# Patient Record
Sex: Male | Born: 1960 | Race: White | Hispanic: No
Health system: Southern US, Academic
[De-identification: ages and names within clinical notes are randomized; demographics above are authoritative.]

## PROBLEM LIST (undated history)

## (undated) ENCOUNTER — Ambulatory Visit: Attending: Allergy & Immunology | Primary: Allergy & Immunology

## (undated) ENCOUNTER — Ambulatory Visit

## (undated) ENCOUNTER — Encounter

## (undated) DIAGNOSIS — E039 Hypothyroidism, unspecified: Secondary | ICD-10-CM

## (undated) DIAGNOSIS — F988 Other specified behavioral and emotional disorders with onset usually occurring in childhood and adolescence: Secondary | ICD-10-CM

## (undated) HISTORY — DX: Hypothyroidism, unspecified: E03.9

## (undated) HISTORY — DX: Other specified behavioral and emotional disorders with onset usually occurring in childhood and adolescence: F98.8

---

## 2011-08-05 ENCOUNTER — Ambulatory Visit: Payer: Self-pay

## 2015-09-16 ENCOUNTER — Other Ambulatory Visit: Payer: Self-pay | Admitting: Internal Medicine

## 2015-09-16 DIAGNOSIS — E039 Hypothyroidism, unspecified: Secondary | ICD-10-CM

## 2015-09-17 ENCOUNTER — Ambulatory Visit
Admission: RE | Admit: 2015-09-17 | Discharge: 2015-09-17 | Disposition: A | Discharge status: Home / Self Care | Payer: No Typology Code available for payment source | Source: Ambulatory Visit | Attending: Internal Medicine | Admitting: Internal Medicine

## 2015-09-17 DIAGNOSIS — E039 Hypothyroidism, unspecified: Secondary | ICD-10-CM

## 2017-09-07 IMAGING — US US SOFT TISSUE HEAD/NECK
1 series · 14 of 25 positions shown · non-contrast
Comparison: None.

CLINICAL DATA: Hypothyroidism

EXAM:
THYROID ULTRASOUND
TECHNIQUE: Ultrasound examination of the thyroid gland and adjacent soft
tissues was performed.

[Series 1: us soft tissue head/neck · 0.08mm/px · 14 of 32 slices shown]
[im 1/32]
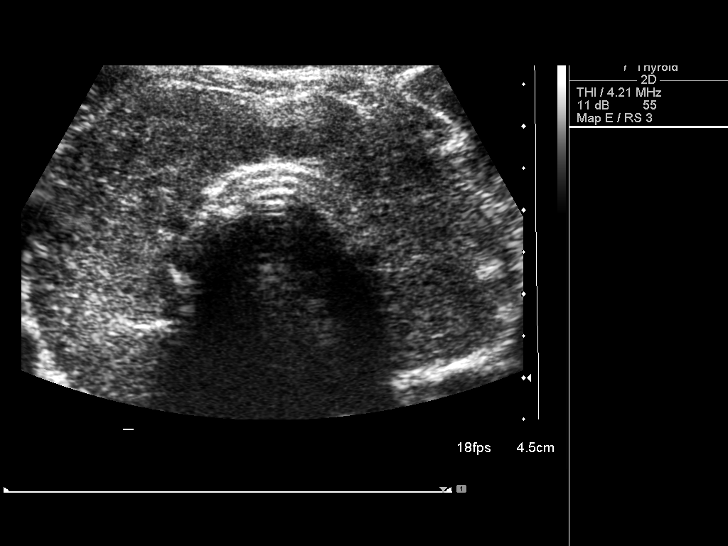
[im 3/32]
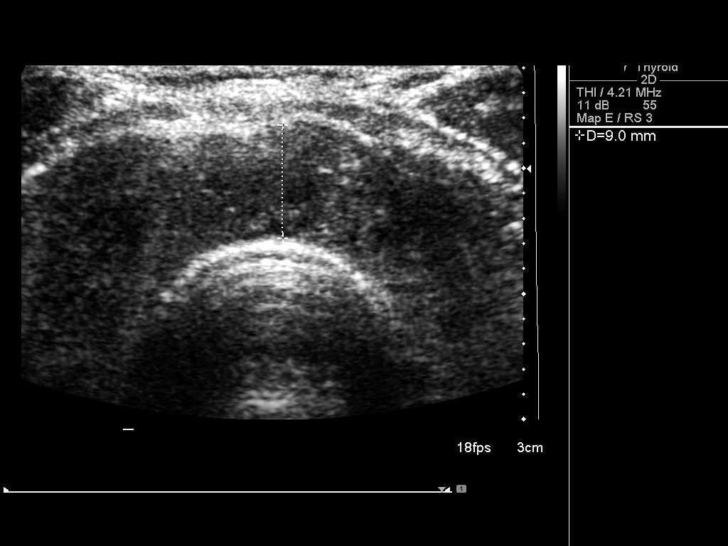
[im 6/32]
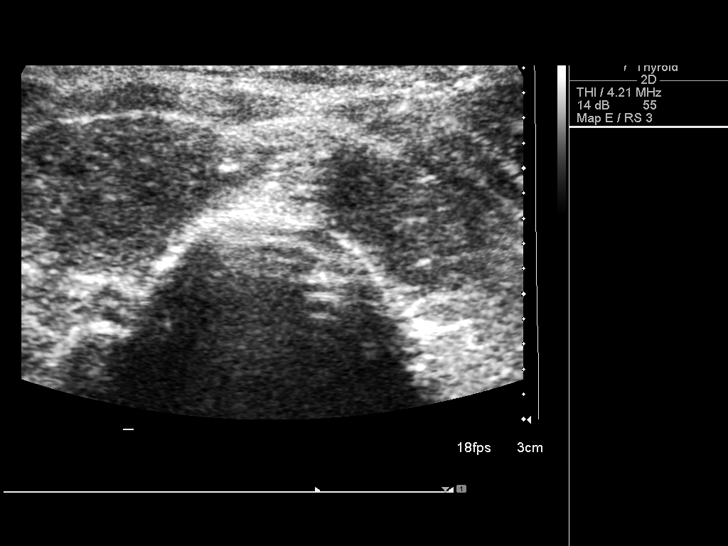
[im 8/32]
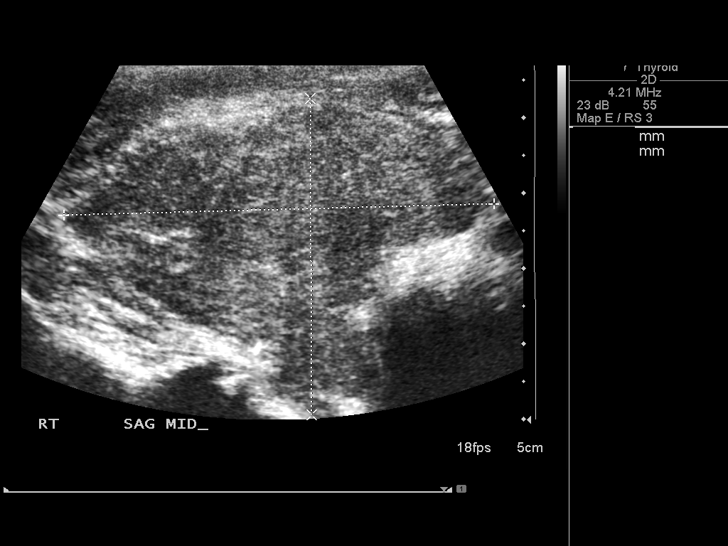
[im 11/32]
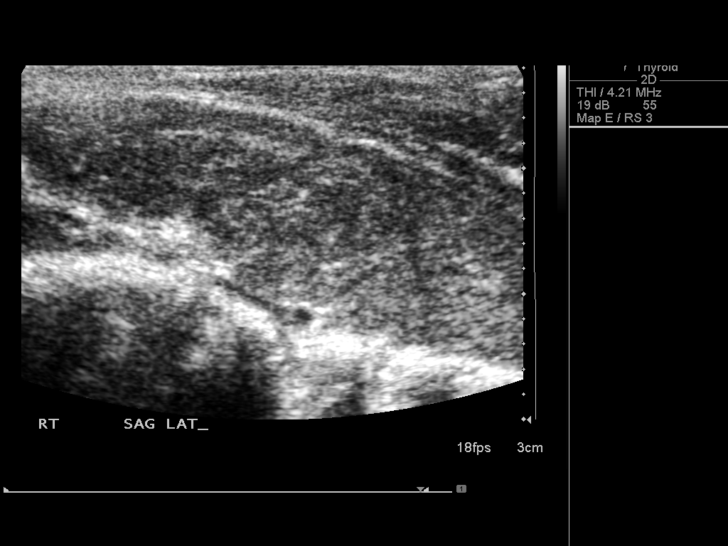
[im 12/32]
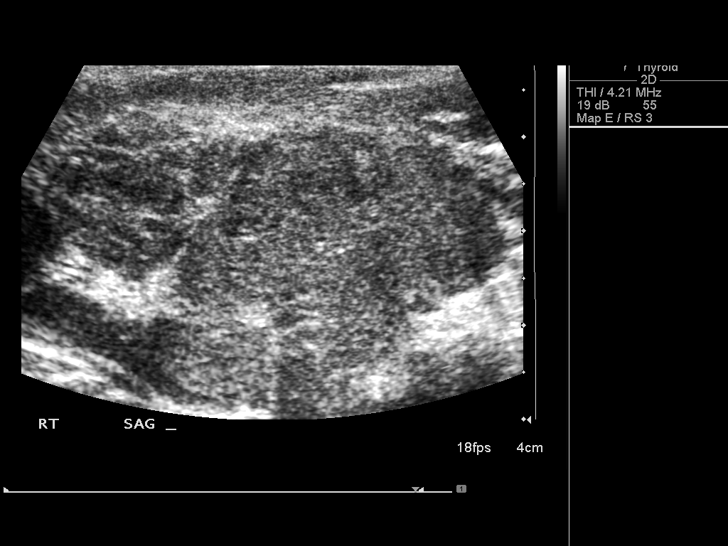
[im 15/32]
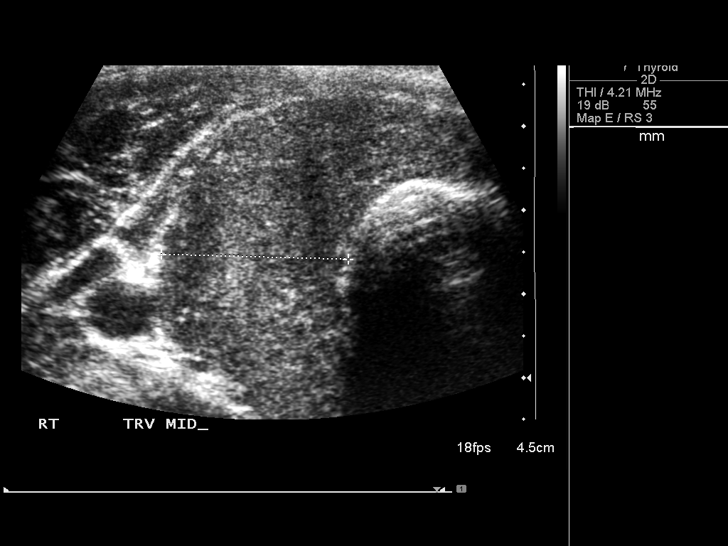
[im 17/32]
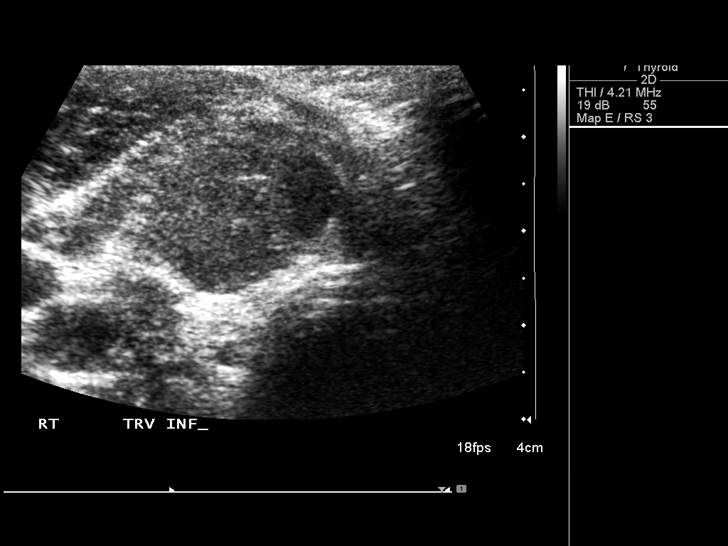
[im 20/32]
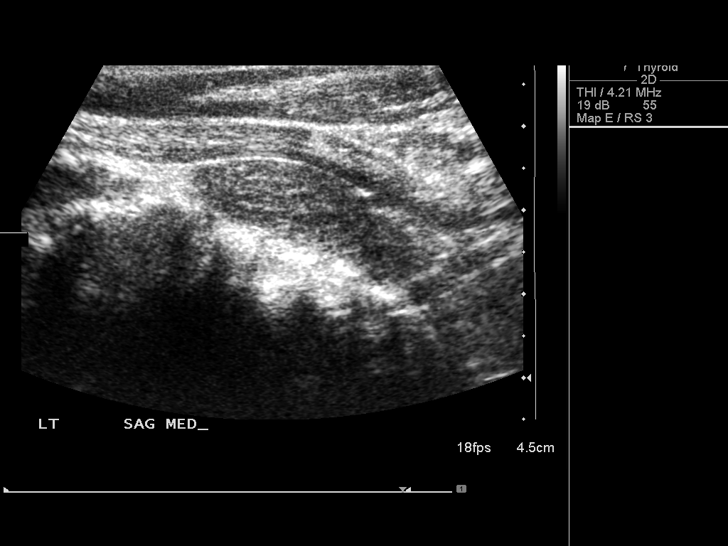
[im 21/32]
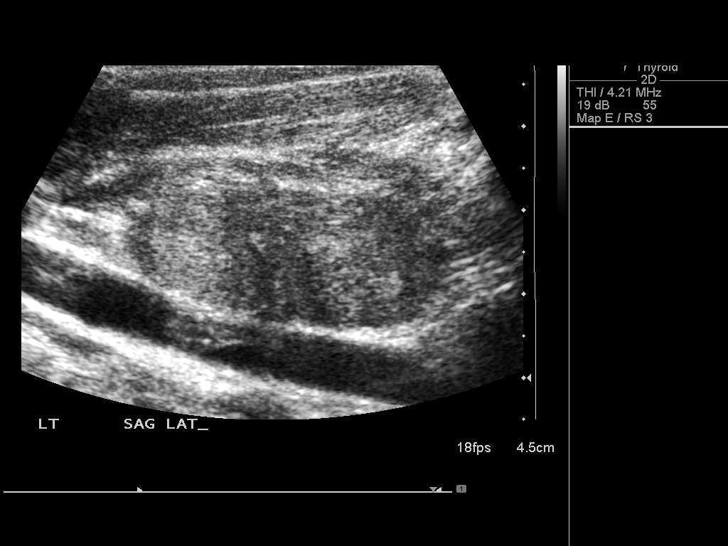
[im 24/32]
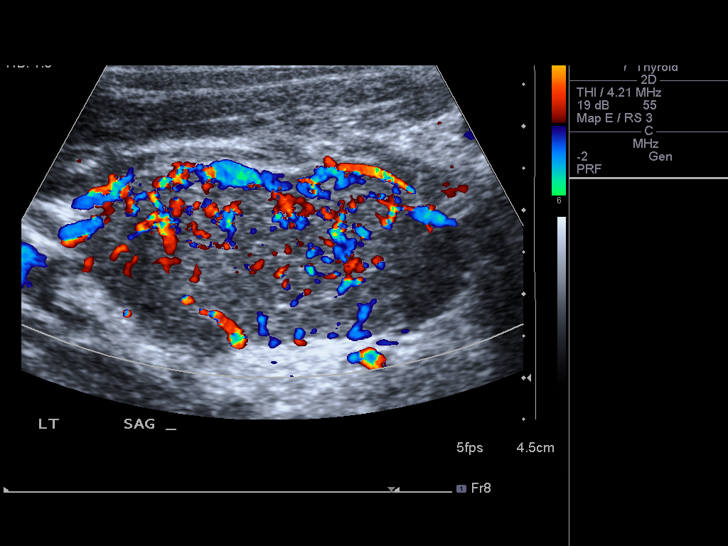
[im 26/32]
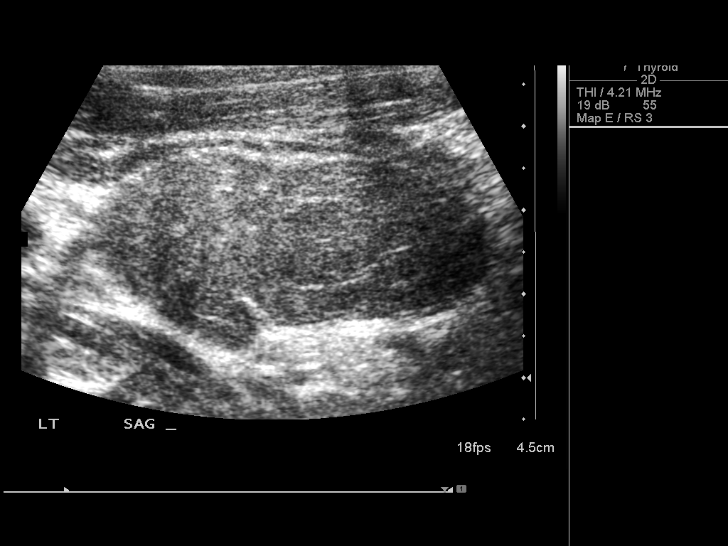
[im 29/32]
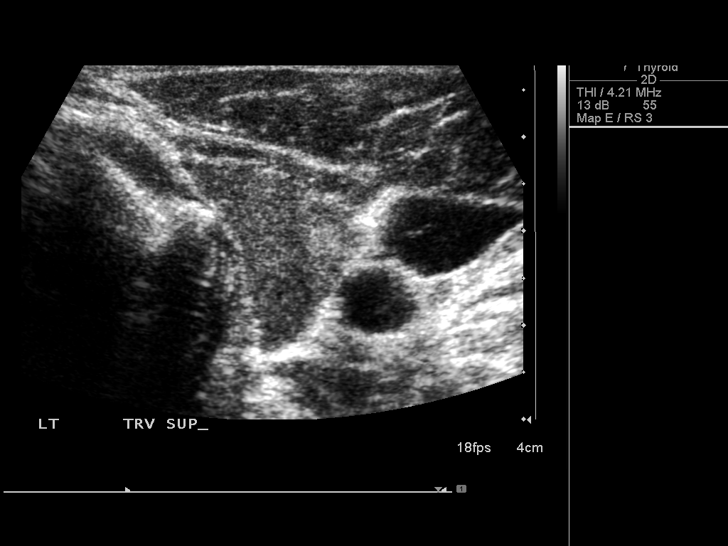
[im 32/32]
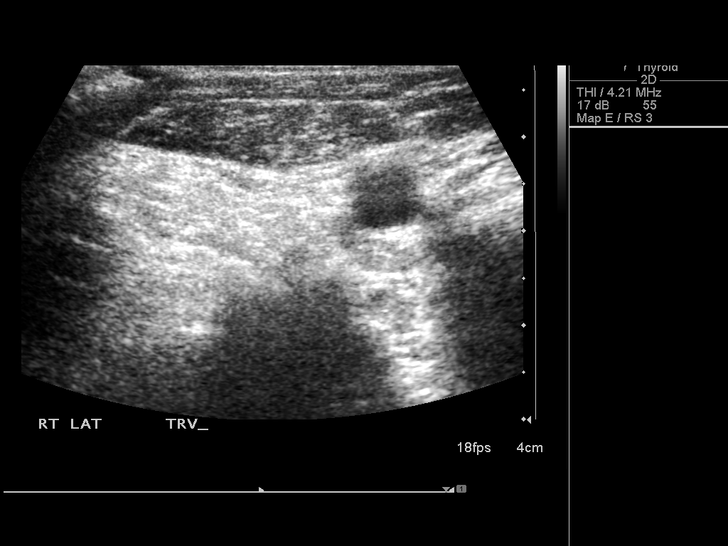

[14 of 25 positions shown; findings below may reference images not displayed]

FINDINGS: Right thyroid lobe

Measurements: 57 x 42 x 22 mm. Inhomogeneous somewhat hyperemic
echotexture without focal lesion.

Left thyroid lobe

Measurements: 54 x 26 x 19 mm.  No nodules visualized.

Isthmus

Thickness: 9 mm.  No nodules visualized.

Lymphadenopathy

None visualized.
IMPRESSION: 1. Mild thyromegaly with inhomogeneous hyperemic parenchyma, no
focal lesion.

## 2018-10-08 ENCOUNTER — Ambulatory Visit
Admission: RE | Admit: 2018-10-08 | Discharge: 2018-10-08 | Disposition: A | Discharge status: Home / Self Care | Payer: No Typology Code available for payment source | Source: Ambulatory Visit | Attending: Internal Medicine | Admitting: Internal Medicine

## 2018-10-08 ENCOUNTER — Other Ambulatory Visit: Payer: Self-pay | Admitting: Internal Medicine

## 2018-10-08 DIAGNOSIS — E010 Iodine-deficiency related diffuse (endemic) goiter: Secondary | ICD-10-CM

## 2020-08-22 DIAGNOSIS — B9689 Other specified bacterial agents as the cause of diseases classified elsewhere: Secondary | ICD-10-CM | POA: Diagnosis not present

## 2020-08-22 DIAGNOSIS — U071 COVID-19: Secondary | ICD-10-CM | POA: Diagnosis not present

## 2020-08-22 DIAGNOSIS — J019 Acute sinusitis, unspecified: Secondary | ICD-10-CM | POA: Diagnosis not present

## 2020-08-31 DIAGNOSIS — R0981 Nasal congestion: Secondary | ICD-10-CM | POA: Diagnosis not present

## 2020-09-14 DIAGNOSIS — J329 Chronic sinusitis, unspecified: Secondary | ICD-10-CM | POA: Diagnosis not present

## 2020-09-14 DIAGNOSIS — J330 Polyp of nasal cavity: Secondary | ICD-10-CM | POA: Diagnosis not present

## 2020-09-14 DIAGNOSIS — J310 Chronic rhinitis: Secondary | ICD-10-CM | POA: Diagnosis not present

## 2020-10-05 DIAGNOSIS — J310 Chronic rhinitis: Secondary | ICD-10-CM | POA: Diagnosis not present

## 2020-10-05 DIAGNOSIS — J330 Polyp of nasal cavity: Secondary | ICD-10-CM | POA: Diagnosis not present

## 2020-10-05 DIAGNOSIS — J329 Chronic sinusitis, unspecified: Secondary | ICD-10-CM | POA: Diagnosis not present

## 2020-11-16 DIAGNOSIS — J329 Chronic sinusitis, unspecified: Secondary | ICD-10-CM | POA: Diagnosis not present

## 2020-11-16 DIAGNOSIS — J310 Chronic rhinitis: Secondary | ICD-10-CM | POA: Diagnosis not present

## 2020-11-16 DIAGNOSIS — J330 Polyp of nasal cavity: Secondary | ICD-10-CM | POA: Diagnosis not present

## 2021-02-10 IMAGING — US US THYROID
1 series · 13 of 25 positions shown · non-contrast
Comparison: 09/16/2005

CLINICAL DATA: Goiter. Thyromegaly with occasional difficulty
swallowing.

EXAM:
THYROID ULTRASOUND
TECHNIQUE: Ultrasound examination of the thyroid gland and adjacent soft
tissues was performed.

[Series 1: us thyroid · 0.06mm/px · 42 acquisitions, 13 frames shown]
[im 1/42]
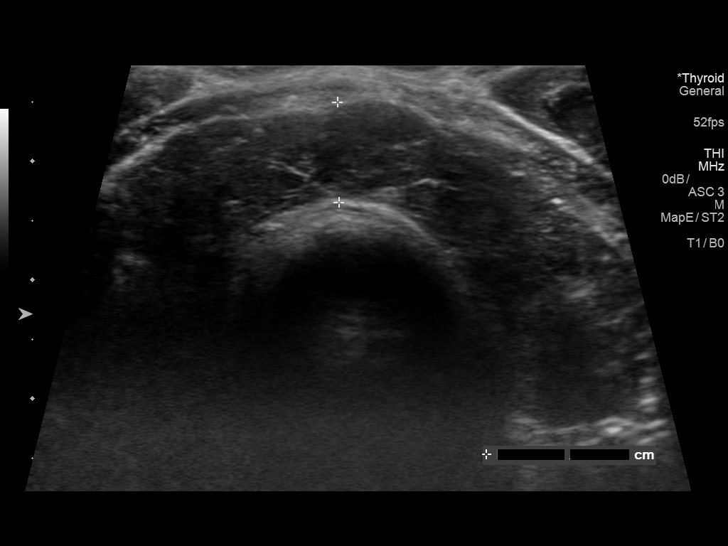
[im 4/42]
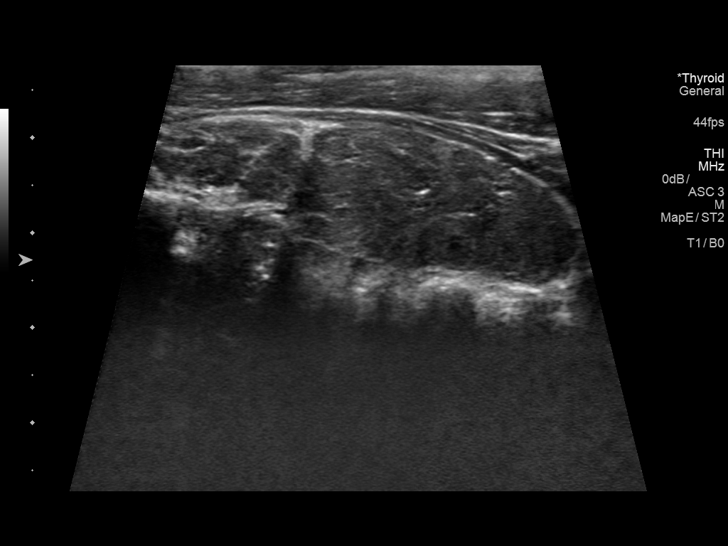
[im 7/42]
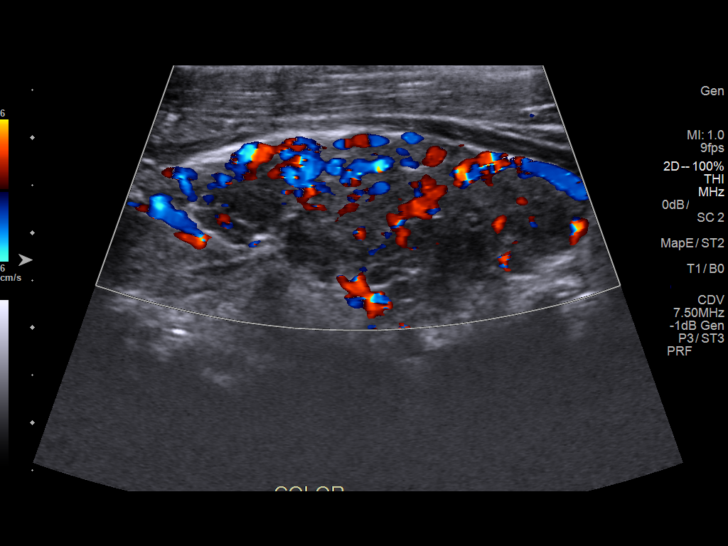
[im 11/42]
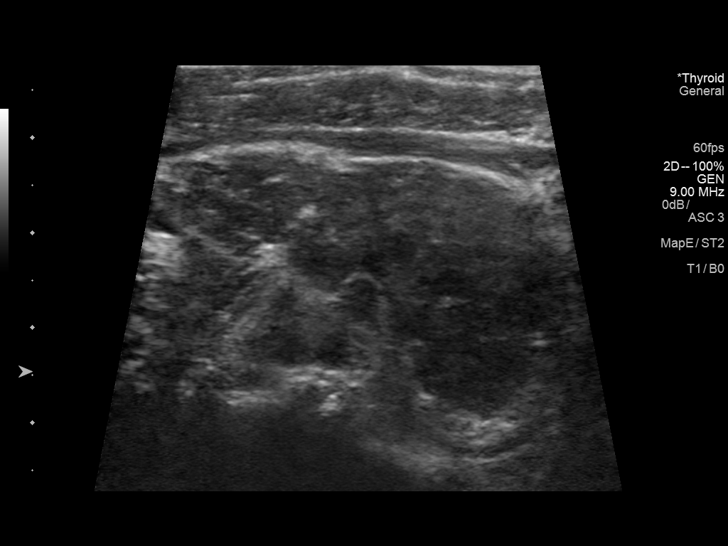
[im 14/42]
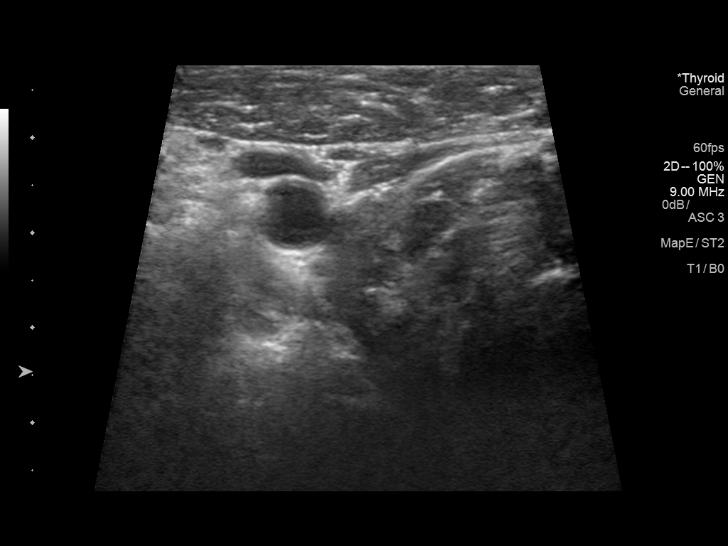
[im 18/42]
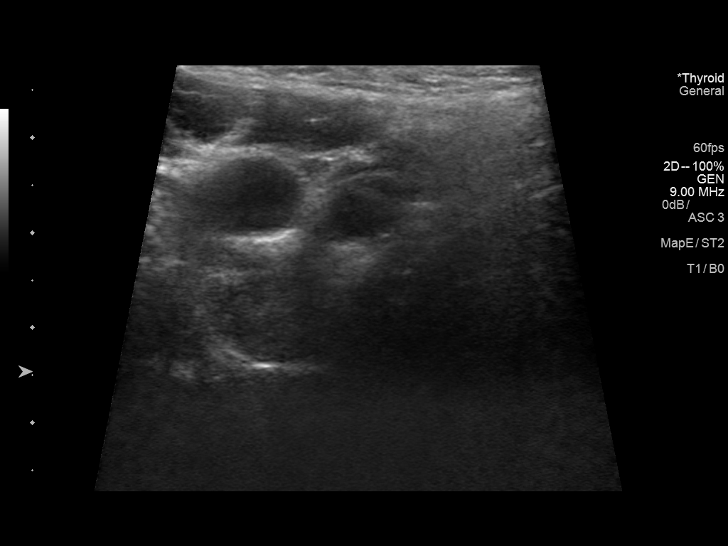
[im 21/42]
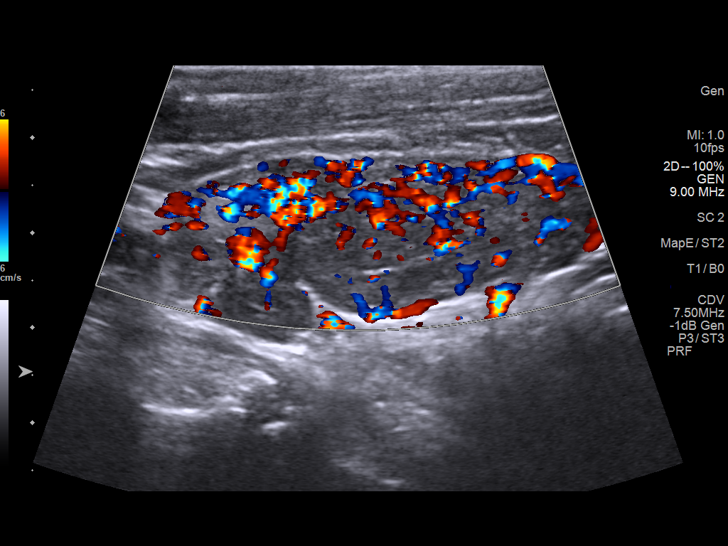
[im 24/42]
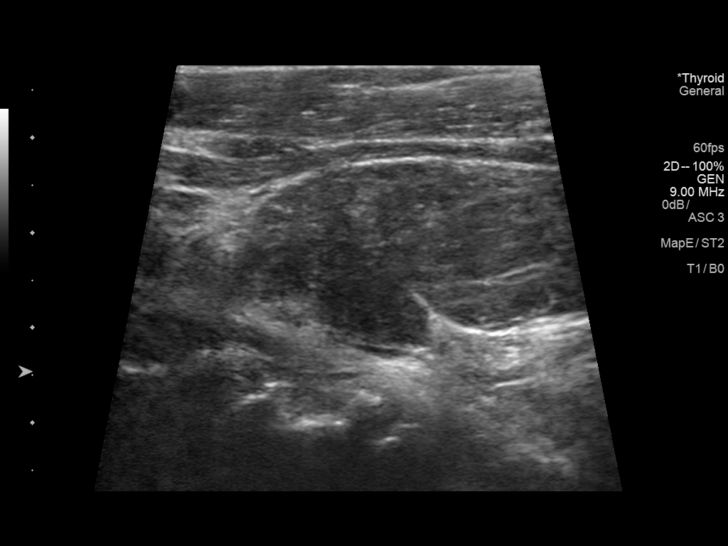
[im 28/42]
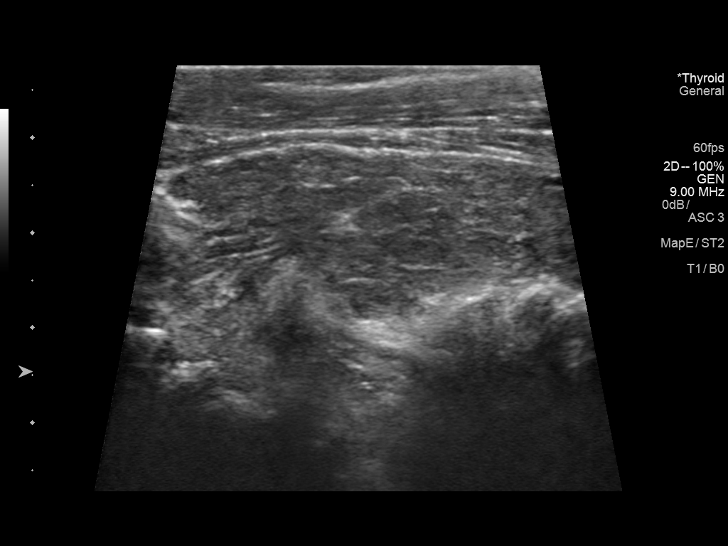
[im 31/42]
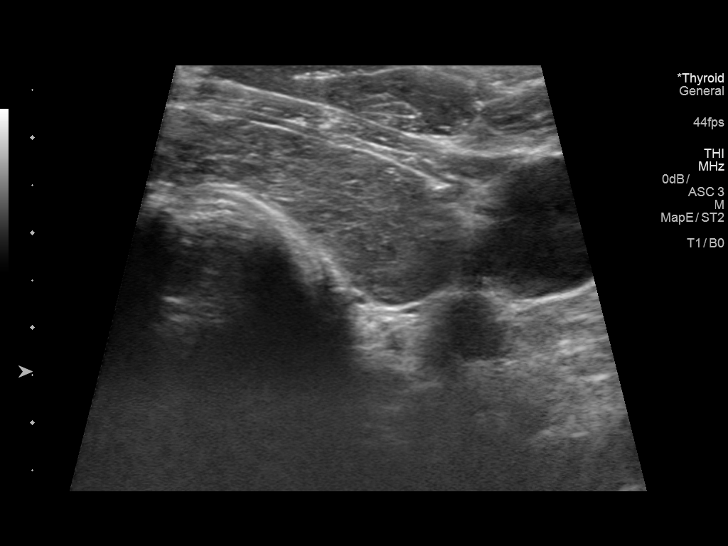
[im 35/42]
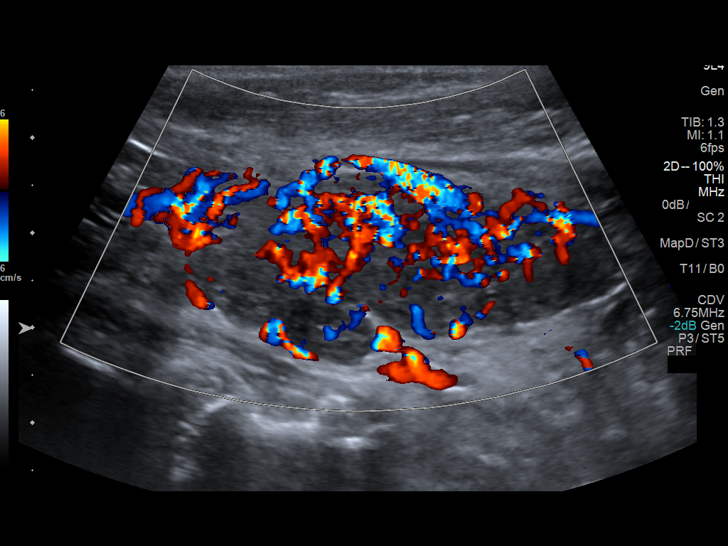
[im 38/42]
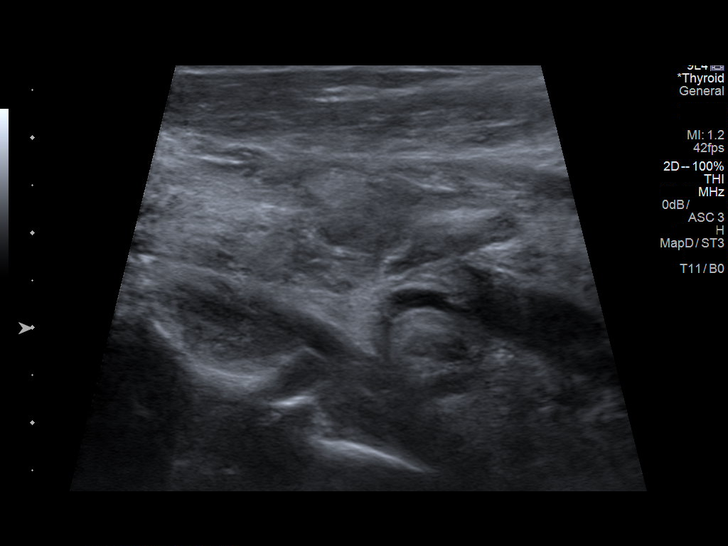
[im 42/42]
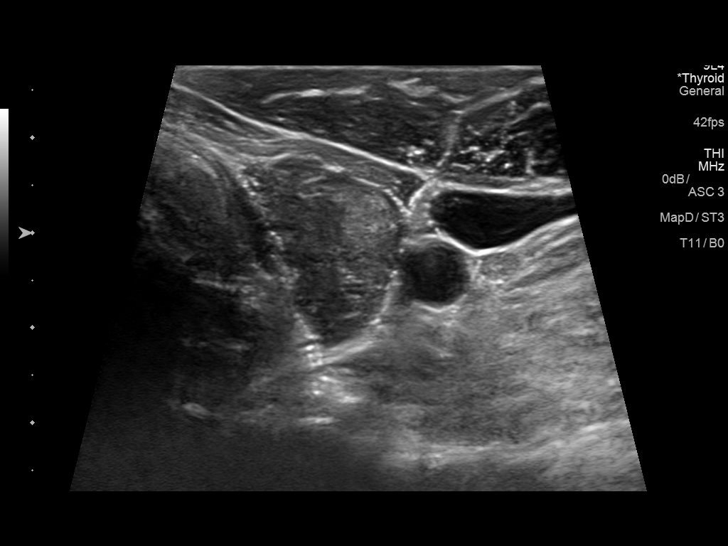

[13 of 25 positions shown; findings below may reference images not displayed]

FINDINGS: Parenchymal Echotexture: Markedly heterogenous

Isthmus: 0.8 cm

Right lobe: 4.5 x 2.7 x 1.8 cm

Left lobe: 4.8 x 1.9 x 1.8 cm

_________________________________________________________

Estimated total number of nodules >/= 1 cm: 0

Number of spongiform nodules >/=  2 cm not described below (TR1): 0

Number of mixed cystic and solid nodules >/= 1.5 cm not described
below (TR2): 0

_________________________________________________________

Overall volume of the thyroid gland is decreased since the prior
ultrasound in 0838. The thyroid parenchyma continues to be markedly
heterogeneous with color Doppler demonstrating increased vascularity
throughout both lobes. Findings are consistent with chronic
thyroiditis. No focal nodules identified. No enlarged lymph nodes
identified in the neck.
IMPRESSION: Decrease in thyroid gland volume since 0838 by ultrasound
measurements. The thyroid parenchyma continues to demonstrate marked
heterogeneous appearance with increased vascularity consistent with
chronic thyroiditis.

The above is in keeping with the ACR TI-RADS recommendations - [HOSPITAL] 0838;[DATE].

## 2021-03-22 DIAGNOSIS — J330 Polyp of nasal cavity: Secondary | ICD-10-CM | POA: Diagnosis not present

## 2021-03-22 DIAGNOSIS — R430 Anosmia: Secondary | ICD-10-CM | POA: Diagnosis not present

## 2021-03-22 DIAGNOSIS — J310 Chronic rhinitis: Secondary | ICD-10-CM | POA: Diagnosis not present

## 2021-07-08 DIAGNOSIS — J324 Chronic pansinusitis: Secondary | ICD-10-CM | POA: Diagnosis not present

## 2021-07-08 DIAGNOSIS — J330 Polyp of nasal cavity: Secondary | ICD-10-CM | POA: Diagnosis not present

## 2021-07-08 DIAGNOSIS — J3089 Other allergic rhinitis: Secondary | ICD-10-CM | POA: Diagnosis not present

## 2021-08-17 DIAGNOSIS — J342 Deviated nasal septum: Secondary | ICD-10-CM | POA: Diagnosis not present

## 2021-08-17 DIAGNOSIS — J324 Chronic pansinusitis: Secondary | ICD-10-CM | POA: Diagnosis not present

## 2021-08-17 DIAGNOSIS — J330 Polyp of nasal cavity: Secondary | ICD-10-CM | POA: Diagnosis not present

## 2021-08-17 DIAGNOSIS — J3089 Other allergic rhinitis: Secondary | ICD-10-CM | POA: Diagnosis not present

## 2021-08-23 DIAGNOSIS — J324 Chronic pansinusitis: Secondary | ICD-10-CM | POA: Insufficient documentation

## 2021-08-23 DIAGNOSIS — J330 Polyp of nasal cavity: Secondary | ICD-10-CM | POA: Insufficient documentation

## 2021-08-23 DIAGNOSIS — J342 Deviated nasal septum: Secondary | ICD-10-CM | POA: Diagnosis not present

## 2021-08-23 DIAGNOSIS — J3089 Other allergic rhinitis: Secondary | ICD-10-CM | POA: Diagnosis not present

## 2021-08-31 DIAGNOSIS — J330 Polyp of nasal cavity: Secondary | ICD-10-CM | POA: Diagnosis not present

## 2021-08-31 DIAGNOSIS — J324 Chronic pansinusitis: Secondary | ICD-10-CM | POA: Diagnosis not present

## 2021-09-07 DIAGNOSIS — J324 Chronic pansinusitis: Secondary | ICD-10-CM | POA: Diagnosis not present

## 2021-09-07 DIAGNOSIS — J330 Polyp of nasal cavity: Secondary | ICD-10-CM | POA: Diagnosis not present

## 2021-09-28 DIAGNOSIS — J330 Polyp of nasal cavity: Secondary | ICD-10-CM | POA: Diagnosis not present

## 2021-09-28 DIAGNOSIS — J324 Chronic pansinusitis: Secondary | ICD-10-CM | POA: Diagnosis not present

## 2021-11-01 DIAGNOSIS — F909 Attention-deficit hyperactivity disorder, unspecified type: Secondary | ICD-10-CM | POA: Diagnosis not present

## 2021-11-01 DIAGNOSIS — Z0000 Encounter for general adult medical examination without abnormal findings: Secondary | ICD-10-CM | POA: Diagnosis not present

## 2021-11-01 DIAGNOSIS — Z6823 Body mass index (BMI) 23.0-23.9, adult: Secondary | ICD-10-CM | POA: Diagnosis not present

## 2021-11-01 DIAGNOSIS — E039 Hypothyroidism, unspecified: Secondary | ICD-10-CM | POA: Diagnosis not present

## 2021-11-02 DIAGNOSIS — Z6823 Body mass index (BMI) 23.0-23.9, adult: Secondary | ICD-10-CM | POA: Diagnosis not present

## 2021-11-02 DIAGNOSIS — Z0000 Encounter for general adult medical examination without abnormal findings: Secondary | ICD-10-CM | POA: Diagnosis not present

## 2021-11-02 DIAGNOSIS — E039 Hypothyroidism, unspecified: Secondary | ICD-10-CM | POA: Diagnosis not present

## 2021-11-02 DIAGNOSIS — F988 Other specified behavioral and emotional disorders with onset usually occurring in childhood and adolescence: Secondary | ICD-10-CM | POA: Diagnosis not present

## 2022-07-11 DIAGNOSIS — R430 Anosmia: Secondary | ICD-10-CM | POA: Diagnosis not present

## 2022-07-11 DIAGNOSIS — J310 Chronic rhinitis: Secondary | ICD-10-CM | POA: Diagnosis not present

## 2022-07-11 DIAGNOSIS — J329 Chronic sinusitis, unspecified: Secondary | ICD-10-CM | POA: Diagnosis not present

## 2022-07-11 DIAGNOSIS — Z720 Tobacco use: Secondary | ICD-10-CM | POA: Diagnosis not present

## 2022-07-12 MED ORDER — DUPIXENT 300 MG/2 ML SUBCUTANEOUS SYRINGE
SUBCUTANEOUS | 11 refills | 28 days
Start: 2022-07-12 — End: ?

## 2022-07-13 NOTE — Unmapped (Addendum)
[facility] Specialty Medication Onboarding    Specialty Medication: DUPIXENT SYRINGE 300 mg/2 mL Syrg injection (dupilumab)  Prior Authorization: Approved   Financial Assistance: No - copay  <$25  Final Copay/Day Supply: $0 / 28 days    Insurance Restrictions: Yes - max 1 month supply     Notes to Pharmacist: n/a    The triage team has completed the benefits investigation and has determined that the patient is able to fill this medication at [facility]. Please contact the patient to complete the onboarding or follow up with the prescribing physician as needed.

## 2022-07-13 NOTE — Unmapped (Signed)
[facility]   Patient Onboarding/Medication Counseling    Ivan Rhodes is a 61 y.o. male with nasal polyps who I am counseling today on initiation of therapy.  I am speaking to the patient.    Was a Nurse, learning disability used for this call? No    Verified patient's date of birth / HIPAA.    Specialty medication(s) to be sent: Inflammatory Disorders: Dupixent      Non-specialty medications/supplies to be sent: n/a      Medications not needed at this time: n/a         Dupixent (dupilumab)    Medication & Administration     Dosage: Rhinosinusitis (chronic) with nasal polyposis: Inject 300mg  every 14 days    Administration:     Dupixent Syringe  1. Gather all supplies needed for injection on a clean, flat working surface: medication syringe removed from packaging, alcohol swab, sharps container, etc.  2. Look at the medication label - look for correct medication, correct dose, and check the expiration date  3. Look at the medication - the liquid in the syringe should appear clear and colorless to pale yellow  4. Lay the syringe on a flat surface and allow it to warm up to room temperature for at least 45 minutes  5. Select injection site - you can use the front of your thigh or your belly (but not the area 2 inches around your belly button); if someone else is giving you the injection you can also use your upper arm in the skin covering your triceps muscle  6. Prepare injection site - wash your hands and clean the skin at the injection site with an alcohol swab and let it air dry, do not touch the injection site again before the injection  7. Hold the middle of the body of the syringe and gently pull the needle safety cap straight out. Be careful not to bend the needle. Do not remove until immediately prior to injection  8. Pinch the skin - with your hand not holding the syringe pinch up a fold of skin at the injection site using your forefinger and thumb  9. Insert the needle into the fold of skin at about a 45 degree angle - it's best to use a quick dart-like motion - with the syringe in position, release the pinch of skin and allow the skin to relax  10. Push the plunger down slowly as far as it will go until the syringe is empty, if the plunger is not fully depressed the needle shield will not extend to cover the needle when it is removed  11. Check that the syringe is empty and keep pressing down on the plunger while you pull the needle out at the same angle as inserted; after the needle is removed completely from the skin, release the plunger allowing the needle shield to activate and cover the used needle  12. Dispose of the used syringe immediately in your sharps disposal container  13. If you see any blood at the injection site, press a cotton ball or gauze on the site and maintain pressure until the bleeding stops, do not rub the injection site    Adherence/Missed dose instructions:  If a dose is missed, administer within 7 days from the missed dose and then resume the original schedule. If the missed dose is not administered within 7 days, you can either wait until the next dose on the original schedule or take your dose now and resume  every 14 days from the new injection date. Do not use 2 doses at the same time or extra doses.      Goals of Therapy     -Control mucosal inflammation and edema  -Maintain adequate sinus ventilation and drainage  -Treatment of colonizing or infecting micro-organisms  -Reduction in the number of acute exacerbations  -Maintenance of effective psychosocial functioning    Side Effects & Monitoring Parameters     Injection site reaction (redness, irritation, inflammation localized to the site of administration)  Signs of a common cold - minor sore throat, runny or stuffy nose, etc.  Recurrence of cold sores (herpes simplex)      The following side effects should be reported to the provider:  Signs of a hypersensitivity reaction - rash; hives; itching; red, swollen, blistered, or peeling skin; wheezing; tightness in the chest or throat; difficulty breathing, swallowing, or talking; swelling of the mouth, face, lips, tongue, or throat; etc.  Eye pain or irritation or any visual disturbances  Shortness of breath or worsening of breathing      Contraindications, Warnings, & Precautions     Have your bloodwork checked as you have been told by your prescriber   Birth control pills and other hormone-based birth control may not work as well to prevent pregnancy  Talk with your doctor if you are pregnant, planning to become pregnant, or breastfeeding  Discuss the possible need for holding your dose(s) of Dupixent?? when a planned procedure is scheduled with the prescriber as it may delay healing/recovery timeline       Drug/Food Interactions     Medication list reviewed in Epic. The patient was instructed to inform the care team before taking any new medications or supplements. No drug interactions identified.   Talk with you prescriber or pharmacist before receiving any live vaccinations while taking this medication and after you stop taking it    Storage, Handling Precautions, & Disposal     Store this medication in the refrigerator.  Do not freeze  If needed, you may store at room temperature for up to 14 days  Store in original packaging, protected from light  Do not shake  Dispose of used syringes in a sharps disposal container            Current Medications (including OTC/herbals), Comorbidities and Allergies     Current Outpatient Medications   Medication Sig Dispense Refill    dupilumab (DUPIXENT SYRINGE) 300 mg/2 mL Syrg injection Inject the contents of 1 syringe (300 mg total) under the skin every fourteen (14) days. 4 mL 11     No current facility-administered medications for this visit.       Not on File    There is no problem list on file for this patient.      Reviewed and up to date in Epic.    Appropriateness of Therapy     Acute infections noted within Epic:  No active infections  Patient reported infection: None    Is medication and dose appropriate based on diagnosis and infection status? Yes    Prescription has been clinically reviewed: Yes      Baseline Quality of Life Assessment      How many days over the past month did your nasal polyps  keep you from your normal activities? For example, brushing your teeth or getting up in the morning. Mr. Pirillo reports increased inflammation and is currently taking a prednisone taper that has improved his symptoms  Financial Information     Medication Assistance provided: Prior Authorization    Anticipated copay of $0 / 28 days reviewed with patient. Verified delivery address.    Delivery Information     Scheduled delivery date: 07/15/22    Expected start date: 07/15/22    Medication will be delivered via UPS to the prescription address in [facility].  This shipment will not require a signature.      Explained the services we provide at [facility] and that each month we would call to set up refills.  Stressed importance of returning phone calls so that we could ensure they receive their medications in time each month.  Informed patient that we should be setting up refills 7-10 days prior to when they will run out of medication.  A pharmacist will reach out to perform a clinical assessment periodically.  Informed patient that a welcome packet, containing information about our pharmacy and other support services, a Notice of Privacy Practices, and a drug information handout will be sent.      The patient or caregiver noted above participated in the development of this care plan and knows that they can request review of or adjustments to the care plan at any time.      Patient or caregiver verbalized understanding of the above information as well as how to contact the pharmacy at [phone] option 4 with any questions/concerns.  The pharmacy is open Monday through Friday 8:30am-4:30pm.  A pharmacist is available 24/7 via pager to answer any clinical questions they may have.    Patient Specific Needs     Does the patient have any physical, cognitive, or cultural barriers? No    Does the patient have adequate living arrangements? (i.e. the ability to store and take their medication appropriately) Yes     Did you identify any home environmental safety or security hazards? No    Patient prefers to have medications discussed with  Patient     Is the patient or caregiver able to read and understand education materials at a high school level or above? Yes    Patient's primary language is  English     Is the patient high risk? No    SOCIAL DETERMINANTS OF HEALTH     At the [facility], we have learned that life circumstances - like trouble affording food, housing, utilities, or transportation can affect the health of many of our patients.   That is why we wanted to ask: are you currently experiencing any life circumstances that are negatively impacting your health and/or quality of life? Patient declined to answer    Social Determinants of Health     Financial Resource Strain: Not on file   Internet Connectivity: Not on file   Food Insecurity: Not on file   Tobacco Use: Not on file   Housing/Utilities: Not on file   Alcohol Use: Not on file   Transportation Needs: Not on file   Substance Use: Not on file   Health Literacy: Not on file   Physical Activity: Not on file   Interpersonal Safety: Not on file   Stress: Not on file   Intimate Partner Violence: Not on file   Depression: Not on file   Social Connections: Not on file       Would you be willing to receive help with any of the needs that you have identified today? Not applicable       Oliva Bustard, PharmD  [facility] Specialty Pharmacist

## 2022-07-14 MED FILL — DUPIXENT 300 MG/2 ML SUBCUTANEOUS SYRINGE: SUBCUTANEOUS | 28 days supply | Qty: 4 | Fill #0

## 2022-07-29 NOTE — Unmapped (Signed)
Ivan Rhodes states he plans to start Dupixent tomorrow. He confirmed he has 2 pens on hand and did not have any additional questions regarding Dupixent administration.     I informed him that I will call him in 2-3 weeks to discuss Dupixent and to schedule the next refill. He verbalized understanding.

## 2022-08-17 NOTE — Unmapped (Signed)
[facility] Clinical Assessment & Refill Coordination Note    Ivan Rhodes, DOB: 06/27/1961  Phone: [phone] (home)     All above HIPAA information was verified with patient.     Was a Nurse, learning disability used for this call? No    Specialty Medication(s):   Inflammatory Disorders: Dupixent     Current Outpatient Medications   Medication Sig Dispense Refill    dupilumab (DUPIXENT SYRINGE) 300 mg/2 mL Syrg injection Inject the contents of 1 syringe (300 mg total) under the skin every fourteen (14) days. 4 mL 11    levothyroxine 112 mcg cap Take 1 capsule by mouth daily.      predniSONE (DELTASONE) 5 MG tablet Take 1 tablet (5 mg total) by mouth daily. Started 10-day taper on 12/4 : Take 5 tabs x 2 days, then 4 tabs x 2 days, then 3 tabs x 2 days, then 2 days x 2 days and then 1 tab x 2 days.       No current facility-administered medications for this visit.        Changes to medications: Ivan Rhodes reports no changes at this time.    No Known Allergies    Changes to allergies: No    SPECIALTY MEDICATION ADHERENCE     Dupixent 300  mg/34mL : 0 days of medicine on hand     Medication Adherence    Patient reported X missed doses in the last month: 0  Specialty Medication: Dupixent 300 mg/97mL Q14d  Patient is on additional specialty medications: No  Patient is on more than two specialty medications: No  Any gaps in refill history greater than 2 weeks in the last 3 months: yes  Demonstrates understanding of importance of adherence: yes  Informant: patient                            Specialty medication(s) dose(s) confirmed: Regimen is correct and unchanged.     Are there any concerns with adherence? No    Adherence counseling provided? Not needed    CLINICAL MANAGEMENT AND INTERVENTION      Clinical Benefit Assessment:    Do you feel the medicine is effective or helping your condition? Yes - Ivan Rhodes can breathe better and smell/taste are slowing improving    Clinical Benefit counseling provided? Not needed    Adverse Effects Assessment:    Are you experiencing any side effects? No    Are you experiencing difficulty administering your medicine? No    Quality of Life Assessment:    Quality of Life    Rheumatology  Oncology  Dermatology  Cystic Fibrosis          How many days over the past month did your nasal polyps  keep you from your normal activities? For example, brushing your teeth or getting up in the morning. 0 - Ivan Rhodes states he can breathe better and smell/taste are slowing improving    Have you discussed this with your provider? Not needed    Acute Infection Status:    Acute infections noted within Epic:  No active infections  Patient reported infection: None    Therapy Appropriateness:    Is therapy appropriate and patient progressing towards therapeutic goals? Yes, therapy is appropriate and should be continued    DISEASE/MEDICATION-SPECIFIC INFORMATION      For patients on injectable medications: Patient currently has 0 doses left.  Next injection is scheduled for ~  1/17.    Chronic Inflammatory Diseases: Have you experienced any flares in the last month? No    PATIENT SPECIFIC NEEDS     Does the patient have any physical, cognitive, or cultural barriers? No    Is the patient high risk? No    Did the patient require a clinical intervention? No    Does the patient require physician intervention or other additional services (i.e., nutrition, smoking cessation, social work)? No    SOCIAL DETERMINANTS OF HEALTH     At the [facility], we have learned that life circumstances - like trouble affording food, housing, utilities, or transportation can affect the health of many of our patients.   That is why we wanted to ask: are you currently experiencing any life circumstances that are negatively impacting your health and/or quality of life? Patient declined to answer    Social Determinants of Health     Financial Resource Strain: Not on file   Internet Connectivity: Not on file   Food Insecurity: Not on file   Tobacco Use: Not on file   Housing/Utilities: Not on file   Alcohol Use: Not on file   Transportation Needs: Not on file   Substance Use: Not on file   Health Literacy: Not on file   Physical Activity: Not on file   Interpersonal Safety: Not on file   Stress: Not on file   Intimate Partner Violence: Not on file   Depression: Not on file   Social Connections: Not on file       Would you be willing to receive help with any of the needs that you have identified today? Not applicable       SHIPPING     Specialty Medication(s) to be Shipped:   Inflammatory Disorders: Dupixent    Other medication(s) to be shipped: No additional medications requested for fill at this time     Changes to insurance: No    Delivery Scheduled: Yes, Expected medication delivery date: 08/24/22.     Medication will be delivered via UPS to the confirmed prescription address in [facility].    The patient will receive a drug information handout for each medication shipped and additional FDA Medication Guides as required.  Verified that patient has previously received a Conservation officer, historic buildings and a Surveyor, mining.    The patient or caregiver noted above participated in the development of this care plan and knows that they can request review of or adjustments to the care plan at any time.      All of the patient's questions and concerns have been addressed.    Oliva Bustard, PharmD   [facility] Specialty Pharmacist

## 2022-08-23 MED FILL — DUPIXENT 300 MG/2 ML SUBCUTANEOUS SYRINGE: SUBCUTANEOUS | 28 days supply | Qty: 4 | Fill #1

## 2022-08-31 ENCOUNTER — Ambulatory Visit: Payer: BC Managed Care – PPO | Admitting: Internal Medicine

## 2022-08-31 ENCOUNTER — Encounter: Payer: Self-pay | Admitting: Internal Medicine

## 2022-08-31 VITALS — BP 114/72 | HR 67 | Temp 98.0°F | Resp 18 | Ht 70.0 in | Wt 167.2 lb

## 2022-08-31 DIAGNOSIS — E039 Hypothyroidism, unspecified: Secondary | ICD-10-CM | POA: Insufficient documentation

## 2022-08-31 DIAGNOSIS — F988 Other specified behavioral and emotional disorders with onset usually occurring in childhood and adolescence: Secondary | ICD-10-CM | POA: Insufficient documentation

## 2022-08-31 MED ORDER — AMPHETAMINE-DEXTROAMPHETAMINE 30 MG PO TABS: 30.0000 mg | ORAL_TABLET | Freq: Every day | ORAL | 0 refills | Status: DC

## 2022-08-31 NOTE — Assessment & Plan Note (Signed)
We will refill his ADD meds at this time.

## 2022-08-31 NOTE — Progress Notes (Unsigned)
Office Visit  Subjective   Patient ID: Don Reynolds   DOB: [birth year]   Age: 62 y.o.   MRN: [id number]   Chief Complaint Chief Complaint  Patient presents with   Follow-up     History of Present Illness The patient is a 62 year old Caucasian/White male who returns for a regularly scheduled thyroid check. Since the last visit, there has been no overall change in his status. He remains on levothyroxine 127mcg daily. He claims to have no symptoms suggestive of thyroid imbalance specifically denying fatigue, cold intolerance, heat intolerance, tremors, anxiety, unexplained weight changes, and insomnia.    The patient is a 62 year old Caucasian/White male who returns for followup of his ADD.  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   There has been no change in his ADD and he denies any problems or side effects with his Adderall.   He is currently on adderall 30mg  po BID.  He denies any side effects from his medications.  He states it helped him focused but he is interested in trying adderall at this time.  Again, he has been having problems with concentration in the past.  He states his concentration was not that great where he could not sit down and read a book and he was easily distracted.  He does have problems with procrastination and difficulty following tasks through to completion.  There are also problems with organization skills.  He does not fidget and he denies any racing thoughts.  However, he admits he does talk excessively and has difficulty waiting his turn and interrupts people at times.  Otherwise, he denies any depression or anxiety.  He states that he had no side effects from his ritalin at this time.                    Past Medical History Past Medical History:  Diagnosis Date   ADD (attention deficit disorder)    Hypothyroidism      Allergies No Known Allergies   Medications  Current Outpatient Medications:     amphetamine-dextroamphetamine (ADDERALL) 30 MG tablet, Take 30 mg by mouth 2 (two) times daily., Disp: , Rfl:    DUPIXENT 300 MG/2ML prefilled syringe, Inject 300 mg into the skin every 14 (fourteen) days., Disp: , Rfl:    Levothyroxine Sodium 112 MCG CAPS, Take 1 capsule by mouth daily., Disp: , Rfl:    Review of Systems Review of Systems  Constitutional:  Negative for chills and fever.  Eyes:  Negative for blurred vision and double vision.  Respiratory:  Negative for cough and shortness of breath.   Cardiovascular:  Negative for chest pain, palpitations and leg swelling.  Gastrointestinal:  Negative for abdominal pain, constipation, diarrhea, nausea and vomiting.  Musculoskeletal:  Negative for myalgias.  Neurological:  Negative for dizziness, weakness and headaches.       Objective:    Vitals BP 114/72 (BP Location: Left Arm, Patient Position: Sitting, Cuff Size: Normal)   Pulse 67   Temp 98 F (36.7 C) (Temporal)   Resp 18   Ht 5\' 10"  (1.778 m)   Wt 167 lb 3.2 oz (75.8 kg)   SpO2 99%   BMI 23.99 kg/m    Physical Examination Physical Exam Constitutional:      Appearance: Normal appearance. He is not ill-appearing.  Cardiovascular:     Rate and Rhythm: Normal rate and regular rhythm.     Pulses: Normal pulses.  Heart sounds: No murmur heard.    No friction rub. No gallop.  Pulmonary:     Effort: Pulmonary effort is normal. No respiratory distress.     Breath sounds: No wheezing, rhonchi or rales.  Abdominal:     General: Abdomen is flat. Bowel sounds are normal. There is no distension.     Palpations: Abdomen is soft.     Tenderness: There is no abdominal tenderness.  Musculoskeletal:     Right lower leg: No edema.     Left lower leg: No edema.  Skin:    General: Skin is warm and dry.     Findings: No rash.  Neurological:     Mental Status: He is alert.        Assessment & Plan:   Hypothyroidism He seems euthyroid at this time.  We will check his  TFT's today.  Attention deficit disorder We will refill his ADD meds at this time.    Return in about 4 months (around 12/30/2022).   Townsend Roger, MD

## 2022-08-31 NOTE — Assessment & Plan Note (Signed)
He seems euthyroid at this time.  We will check his TFT's today.

## 2022-09-01 LAB — T4, FREE: Free T4: 1.57 ng/dL (ref 0.82–1.77)

## 2022-09-01 LAB — TSH: TSH: 0.334 u[IU]/mL — ABNORMAL LOW (ref 0.450–4.500)

## 2022-09-05 NOTE — Progress Notes (Signed)
Patient called.  Patient aware.  

## 2022-09-08 NOTE — Unmapped (Signed)
[facility] Refill Coordination Note    Specialty Medication(s) to be Shipped:   Inflammatory Disorders: Dupixent    Other medication(s) to be shipped: No additional medications requested for fill at this time     Ivan Rhodes, DOB: Jul 17, 1961  Phone: [phone] (home)       All above HIPAA information was verified with patient.     Was a Nurse, learning disability used for this call? No    Completed refill call assessment today to schedule patient's medication shipment from the [facility] [phone]).  All relevant notes have been reviewed.     Specialty medication(s) and dose(s) confirmed: Regimen is correct and unchanged.   Changes to medications: Ivan Rhodes reports no changes at this time.  Changes to insurance: No  New side effects reported not previously addressed with a pharmacist or physician: None reported - Still experiencing joint pain with Dupixent injections which he discussed with provider. I informed him he can take OTC pain meds to help manage but inform provider is pain worsens. He verbalized understanding.  Questions for the pharmacist: No    Confirmed patient received a Conservation officer, historic buildings and a Surveyor, mining with first shipment. The patient will receive a drug information handout for each medication shipped and additional FDA Medication Guides as required.       DISEASE/MEDICATION-SPECIFIC INFORMATION        For patients on injectable medications: Patient currently has 1 doses left.  Next injection is scheduled for 2/1.    SPECIALTY MEDICATION ADHERENCE     Medication Adherence    Patient reported X missed doses in the last month: 0  Specialty Medication: Dupixent 300 mg/10mL Q14d  Patient is on additional specialty medications: No  Patient is on more than two specialty medications: No  Any gaps in refill history greater than 2 weeks in the last 3 months: no  Demonstrates understanding of importance of adherence: yes  Informant: patient                            Were doses missed due to medication being on hold? No    Dupixent 300  mg/29mL : 0 days of medicine on hand     REFERRAL TO PHARMACIST     Referral to the pharmacist: Not needed      [facility]     Shipping address confirmed in Epic.     Delivery Scheduled: Yes, Expected medication delivery date: 09/13/22.     Medication will be delivered via UPS to the prescription address in Epic WAM.    Ivan Rhodes, PharmD   [facility] Specialty Pharmacist

## 2022-09-10 ENCOUNTER — Other Ambulatory Visit: Payer: Self-pay | Admitting: Internal Medicine

## 2022-09-10 MED ORDER — AMPHETAMINE-DEXTROAMPHETAMINE 30 MG PO TABS: 30.0000 mg | ORAL_TABLET | Freq: Two times a day (BID) | ORAL | 0 refills | Status: DC

## 2022-09-12 DIAGNOSIS — Z720 Tobacco use: Secondary | ICD-10-CM | POA: Diagnosis not present

## 2022-09-12 DIAGNOSIS — J329 Chronic sinusitis, unspecified: Secondary | ICD-10-CM | POA: Diagnosis not present

## 2022-09-12 DIAGNOSIS — J310 Chronic rhinitis: Secondary | ICD-10-CM | POA: Diagnosis not present

## 2022-09-12 DIAGNOSIS — J330 Polyp of nasal cavity: Secondary | ICD-10-CM | POA: Diagnosis not present

## 2022-09-12 NOTE — Unmapped (Signed)
Ivan Rhodes 's DUPIXENT SYRINGE 300 mg/2 mL Syrg injection (dupilumab) shipment will be delayed as a result of a high copay.     I have reached out to the patient  at [phone] and communicated the delay. We will wait for a call back from the patient to reschedule the delivery.  We have not confirmed the new delivery date.

## 2022-09-15 ENCOUNTER — Other Ambulatory Visit: Payer: Self-pay | Admitting: Internal Medicine

## 2022-09-15 MED ORDER — HYDROCORTISONE (PERIANAL) 2.5 % EX CREA: 1.0000 | TOPICAL_CREAM | Freq: Two times a day (BID) | CUTANEOUS | 0 refills | Status: AC

## 2022-09-16 NOTE — Unmapped (Signed)
[name] 's DUPIXENT SYRINGE 300 mg/2 mL Syrg injection (dupilumab) shipment will be canceled  as a result of a high copay.     I have reached out to the patient  at [phone] and communicated the delay. We will not reschedule the medication and have removed this/these medication(s) from the work request.  We have canceled this work request.

## 2022-10-21 NOTE — Unmapped (Signed)
The [facility] has made a third and final attempt to reach this patient to refill the following medication:DUPIXENT SYRINGE 300 mg/2 mL Syrg injection (dupilumab).      We have left voicemails on the following phone numbers: [phone], have left voicemail with Clennon Maczko at the following phone numbers: [phone], and have sent a text message to the following phone numbers: [phone] .    Dates contacted: 02/26    03/06    03/15  Last scheduled delivery: 08/23/22- Dupixent MyWay card needs to be updated.    The patient may be at risk of non-compliance with this medication. The patient should call the [facility] at [phone]  Option 4, then Option 2 (all other specialty patients) to refill medication.    Arnoldo Hildreth' W Halana Deisher   [facility] Specialty Technician

## 2022-12-28 ENCOUNTER — Ambulatory Visit: Payer: BC Managed Care – PPO | Admitting: Internal Medicine

## 2022-12-30 ENCOUNTER — Encounter: Payer: Self-pay | Admitting: Internal Medicine

## 2022-12-30 ENCOUNTER — Ambulatory Visit: Payer: BC Managed Care – PPO | Admitting: Internal Medicine

## 2022-12-30 VITALS — BP 124/78 | HR 59 | Temp 97.2°F | Resp 18 | Ht 69.0 in | Wt 163.0 lb

## 2022-12-30 DIAGNOSIS — E039 Hypothyroidism, unspecified: Secondary | ICD-10-CM | POA: Diagnosis not present

## 2022-12-30 DIAGNOSIS — F988 Other specified behavioral and emotional disorders with onset usually occurring in childhood and adolescence: Secondary | ICD-10-CM | POA: Diagnosis not present

## 2022-12-30 DIAGNOSIS — R1310 Dysphagia, unspecified: Secondary | ICD-10-CM

## 2022-12-30 MED ORDER — PANTOPRAZOLE SODIUM 40 MG PO TBEC: 40.0000 mg | DELAYED_RELEASE_TABLET | Freq: Every day | ORAL | 2 refills | Status: AC

## 2022-12-30 MED ORDER — AMPHETAMINE-DEXTROAMPHETAMINE 30 MG PO TABS: 30.0000 mg | ORAL_TABLET | Freq: Every day | ORAL | 0 refills | Status: DC

## 2022-12-30 NOTE — Progress Notes (Addendum)
Office Visit  Subjective   Patient ID: Mickey Nelan   DOB: 1961/04/16   Age: 62 y.o.   MRN: [id number]   Chief Complaint Chief Complaint  Patient presents with   Follow-up     History of Present Illness The patient is a 62 year old Caucasian/White male who returns for a regularly scheduled thyroid check. Since the last visit, there has been no overall change in his status. He remains on levothyroxine daily. He claims to have no symptoms suggestive of thyroid imbalance specifically denying fatigue, cold intolerance, heat intolerance, tremors, anxiety, unexplained weight changes, dry skin, constipation and insomnia.  His last thyroid US was on 10/2018 and this showed a decrease in thyroid gland volume since 2017 by ultrasound measurements. The thyroid parenchyma continues to demonstrate markedheterogeneous appearance with increased vascularity consistent with chronic thyroiditis.  He is complaining of some dysphagia with solids but he denies any reflux symptoms.   The patient is a 62 year old Caucasian/White male who returns for followup of his ADD.  He states that since his last visit, there has been no change in his ADD and he denies any problems or side effects with his Adderall.   He is currently on adderall 30mg  po BID.  He denies any side effects from his medications.  He states it helped him focused but he is interested in trying adderall at this time.  Again, he has been having problems with concentration in the past.  He states his concentration was not that great where he could not sit down and read a book and he was easily distracted.  He does have problems with procrastination and difficulty following tasks through to completion.  There are also problems with organization skills.  He does not fidget and he denies any racing thoughts.  However, he admits he does talk excessively and has difficulty waiting his turn and interrupts people at times.  Otherwise, he denies any depression or  anxiety.  He states that he had no side effects from his ritalin at this time.      Past Medical History Past Medical History:  Diagnosis Date   ADD (attention deficit disorder)    Hypothyroidism      Allergies No Known Allergies   Medications  Current Outpatient Medications:    amphetamine-dextroamphetamine (ADDERALL) 30 MG tablet, Take 1 tablet by mouth daily before breakfast., Disp: 30 tablet, Rfl: 0   [START ON 01/29/2023] amphetamine-dextroamphetamine (ADDERALL) 30 MG tablet, Take 1 tablet by mouth daily before breakfast., Disp: 30 tablet, Rfl: 0   [START ON 02/28/2023] amphetamine-dextroamphetamine (ADDERALL) 30 MG tablet, Take 1 tablet by mouth daily before breakfast., Disp: 30 tablet, Rfl: 0   pantoprazole (PROTONIX) 40 MG tablet, Take 1 tablet (40 mg total) by mouth daily., Disp: 30 tablet, Rfl: 2   amphetamine-dextroamphetamine (ADDERALL) 30 MG tablet, Take 1 tablet by mouth daily before breakfast., Disp: 30 tablet, Rfl: 0   amphetamine-dextroamphetamine (ADDERALL) 30 MG tablet, Take 1 tablet by mouth daily before breakfast., Disp: 30 tablet, Rfl: 0   amphetamine-dextroamphetamine (ADDERALL) 30 MG tablet, Take 1 tablet by mouth daily before breakfast., Disp: 30 tablet, Rfl: 0   amphetamine-dextroamphetamine (ADDERALL) 30 MG tablet, Take 1 tablet by mouth 2 (two) times daily., Disp: 60 tablet, Rfl: 0   DUPIXENT 300 MG/2ML prefilled syringe, Inject 300 mg into the skin every 14 (fourteen) days., Disp: , Rfl:    hydrocortisone (ANUSOL-HC) 2.5 % rectal cream, Place 1 Application rectally 2 (two) times daily., Disp:  30 g, Rfl: 0   Levothyroxine Sodium 112 MCG CAPS, Take 1 capsule by mouth daily., Disp: , Rfl:    Review of Systems Review of Systems  Constitutional:  Negative for chills and fever.  Eyes:  Negative for blurred vision and double vision.  Respiratory:  Negative for cough and shortness of breath.   Cardiovascular:  Negative for chest pain, palpitations and leg  swelling.  Gastrointestinal:  Negative for abdominal pain, constipation, diarrhea, nausea and vomiting.  Skin:  Negative for rash.  Neurological:  Negative for dizziness, weakness and headaches.       Objective:    Vitals BP 124/78 (BP Location: Left Arm, Patient Position: Sitting, Cuff Size: Normal)   Pulse (!) 59   Temp (!) 97.2 F (36.2 C) (Temporal)   Resp 18   Ht 5\' 9"  (1.753 m)   Wt 163 lb (73.9 kg)   SpO2 99%   BMI 24.07 kg/m    Physical Examination Physical Exam Neck:     Thyroid: Thyromegaly present. No thyroid tenderness.  Lymphadenopathy:     Cervical:     Right cervical: No superficial or posterior cervical adenopathy.    Left cervical: No superficial or posterior cervical adenopathy.        Assessment & Plan:   Dysphagia The patient is having dysphagia.  I wonder if this could be related to his thyroid.  We will obtain a thyroid US and start him on protonix.  We will refer him to GI for evaluation for endoscopy.  Hypothyroidism He seems euthyroid.  We will check his TFT's today.  Attention deficit disorder His ADD is controlled.  He has a new job in [employer].  We will give him 3 months of medications.    Return in about 3 months (around 04/01/2023).   Crist Fat, MD

## 2022-12-30 NOTE — Addendum Note (Signed)
Addended by: Crist Fat on: 12/30/2022 09:48 AM   Modules accepted: Orders

## 2022-12-30 NOTE — Assessment & Plan Note (Signed)
The patient is having dysphagia.  I wonder if this could be related to his thyroid.  We will obtain a thyroid US and start him on protonix.  We will refer him to GI for evaluation for endoscopy.

## 2022-12-30 NOTE — Assessment & Plan Note (Signed)
His ADD is controlled.  He has a new job in [employer].  We will give him 3 months of medications.

## 2022-12-30 NOTE — Assessment & Plan Note (Signed)
He seems euthyroid.  We will check his TFT's today. 

## 2022-12-31 LAB — TSH: TSH: 0.732 u[IU]/mL (ref 0.450–4.500)

## 2022-12-31 LAB — T4, FREE: Free T4: 1.57 ng/dL (ref 0.82–1.77)

## 2023-01-03 ENCOUNTER — Other Ambulatory Visit: Payer: Self-pay

## 2023-01-03 ENCOUNTER — Other Ambulatory Visit: Payer: Self-pay | Admitting: Internal Medicine

## 2023-01-03 MED ORDER — LEVOTHYROXINE SODIUM 112 MCG PO CAPS: 1.0000 | ORAL_CAPSULE | Freq: Every day | ORAL | 0 refills | Status: DC

## 2023-01-04 ENCOUNTER — Other Ambulatory Visit: Payer: Self-pay

## 2023-01-04 MED ORDER — LEVOTHYROXINE SODIUM 112 MCG PO TABS: 112.0000 ug | ORAL_TABLET | Freq: Every day | ORAL | 0 refills | Status: DC

## 2023-01-04 NOTE — Telephone Encounter (Signed)
Sent in yesterday.

## 2023-01-16 DIAGNOSIS — Z720 Tobacco use: Secondary | ICD-10-CM | POA: Diagnosis not present

## 2023-01-16 DIAGNOSIS — J330 Polyp of nasal cavity: Secondary | ICD-10-CM | POA: Diagnosis not present

## 2023-01-16 DIAGNOSIS — J310 Chronic rhinitis: Secondary | ICD-10-CM | POA: Diagnosis not present

## 2023-03-14 ENCOUNTER — Other Ambulatory Visit: Payer: Self-pay | Admitting: Internal Medicine

## 2023-03-24 DIAGNOSIS — Z1211 Encounter for screening for malignant neoplasm of colon: Secondary | ICD-10-CM | POA: Diagnosis not present

## 2023-03-24 DIAGNOSIS — F17210 Nicotine dependence, cigarettes, uncomplicated: Secondary | ICD-10-CM | POA: Diagnosis not present

## 2023-03-24 DIAGNOSIS — D1720 Benign lipomatous neoplasm of skin and subcutaneous tissue of unspecified limb: Secondary | ICD-10-CM | POA: Diagnosis not present

## 2023-03-31 ENCOUNTER — Telehealth: Payer: BC Managed Care – PPO | Admitting: Internal Medicine

## 2023-04-18 DIAGNOSIS — F17210 Nicotine dependence, cigarettes, uncomplicated: Secondary | ICD-10-CM | POA: Diagnosis not present

## 2023-04-18 DIAGNOSIS — Z87891 Personal history of nicotine dependence: Secondary | ICD-10-CM | POA: Diagnosis not present

## 2023-04-28 ENCOUNTER — Telehealth: Payer: BC Managed Care – PPO | Admitting: Internal Medicine

## 2023-04-28 DIAGNOSIS — F909 Attention-deficit hyperactivity disorder, unspecified type: Secondary | ICD-10-CM | POA: Diagnosis not present

## 2023-04-28 DIAGNOSIS — F988 Other specified behavioral and emotional disorders with onset usually occurring in childhood and adolescence: Secondary | ICD-10-CM

## 2023-04-28 MED ORDER — AMPHETAMINE-DEXTROAMPHETAMINE 30 MG PO TABS: 30.0000 mg | ORAL_TABLET | Freq: Two times a day (BID) | ORAL | 0 refills | Status: DC

## 2023-04-28 NOTE — Assessment & Plan Note (Signed)
He seems to be doing well.  We will refill his meds today and see him back in 4 months.

## 2023-04-28 NOTE — Progress Notes (Signed)
Office Visit  Subjective   Patient ID: Don Reynolds   DOB: 31-May-1961   Age: 62 y.o.   MRN: [id number]   Chief Complaint No chief complaint on file.    History of Present Illness The patient is a 62 year old Caucasian/White male who returns for followup of his ADD with a telehealth visit.Marland Kitchen  He states that since his last visit, there has been no change in his ADD and he denies any problems or side effects with his Adderall.   He is currently on adderall 30mg  po BID.  He denies any side effects from his medications.  He states it helped him focused but he is interested in trying adderall at this time.  Again, he has been having problems with concentration in the past.  He states his concentration was not that great where he could not sit down and read a book and he was easily distracted.  He does have problems with procrastination and difficulty following tasks through to completion.  There are also problems with organization skills.  He does not fidget and he denies any racing thoughts.  However, he admits he does talk excessively and has difficulty waiting his turn and interrupts people at times.  Otherwise, he denies any depression or anxiety.  He states that he had no side effects from his ritalin at this time.     Past Medical History Past Medical History:  Diagnosis Date   ADD (attention deficit disorder)    Hypothyroidism      Allergies No Known Allergies   Medications  Current Outpatient Medications:    amphetamine-dextroamphetamine (ADDERALL) 30 MG tablet, Take 1 tablet by mouth 2 (two) times daily., Disp: 60 tablet, Rfl: 0   [START ON 05/28/2023] amphetamine-dextroamphetamine (ADDERALL) 30 MG tablet, Take 1 tablet by mouth 2 (two) times daily., Disp: 60 tablet, Rfl: 0   [START ON 06/27/2023] amphetamine-dextroamphetamine (ADDERALL) 30 MG tablet, Take 1 tablet by mouth 2 (two) times daily., Disp: 60 tablet, Rfl: 0   amphetamine-dextroamphetamine (ADDERALL) 30 MG tablet, Take 1  tablet by mouth daily before breakfast., Disp: 30 tablet, Rfl: 0   amphetamine-dextroamphetamine (ADDERALL) 30 MG tablet, Take 1 tablet by mouth daily before breakfast., Disp: 30 tablet, Rfl: 0   amphetamine-dextroamphetamine (ADDERALL) 30 MG tablet, Take 1 tablet by mouth daily before breakfast., Disp: 30 tablet, Rfl: 0   amphetamine-dextroamphetamine (ADDERALL) 30 MG tablet, Take 1 tablet by mouth 2 (two) times daily., Disp: 60 tablet, Rfl: 0   amphetamine-dextroamphetamine (ADDERALL) 30 MG tablet, Take 1 tablet by mouth daily before breakfast., Disp: 30 tablet, Rfl: 0   amphetamine-dextroamphetamine (ADDERALL) 30 MG tablet, Take 1 tablet by mouth daily before breakfast., Disp: 30 tablet, Rfl: 0   amphetamine-dextroamphetamine (ADDERALL) 30 MG tablet, Take 1 tablet by mouth daily before breakfast., Disp: 30 tablet, Rfl: 0   DUPIXENT 300 MG/2ML prefilled syringe, Inject 300 mg into the skin every 14 (fourteen) days., Disp: , Rfl:    hydrocortisone (ANUSOL-HC) 2.5 % rectal cream, Place 1 Application rectally 2 (two) times daily., Disp: 30 g, Rfl: 0   levothyroxine (SYNTHROID) 112 MCG tablet, TAKE 1 TABLET(112 MCG) BY MOUTH DAILY, Disp: 90 tablet, Rfl: 0   pantoprazole (PROTONIX) 40 MG tablet, Take 1 tablet (40 mg total) by mouth daily., Disp: 30 tablet, Rfl: 2   Review of Systems Review of Systems  Constitutional:  Negative for chills, fever, malaise/fatigue and weight loss.  Respiratory:  Negative for cough and shortness of breath.   Cardiovascular:  Negative for chest pain and palpitations.  Gastrointestinal:  Negative for abdominal pain, constipation, diarrhea, nausea and vomiting.  Neurological:  Negative for dizziness, weakness and headaches.       Objective:    Vitals There were no vitals taken for this visit.   Physical Examination Physical Exam Constitutional:      Appearance: Normal appearance.  Neurological:     General: No focal deficit present.     Mental Status: He is  alert and oriented to person, place, and time.        Assessment & Plan:   Attention deficit disorder He seems to be doing well.  We will refill his meds today and see him back in 4 months.    Return in about 4 months (around 08/28/2023).   Crist Fat, MD

## 2023-05-03 DIAGNOSIS — D179 Benign lipomatous neoplasm, unspecified: Secondary | ICD-10-CM | POA: Diagnosis not present

## 2023-05-03 DIAGNOSIS — Z1211 Encounter for screening for malignant neoplasm of colon: Secondary | ICD-10-CM | POA: Diagnosis not present

## 2023-06-07 DIAGNOSIS — Z860100 Personal history of colon polyps, unspecified: Secondary | ICD-10-CM | POA: Diagnosis not present

## 2023-06-07 DIAGNOSIS — Z1211 Encounter for screening for malignant neoplasm of colon: Secondary | ICD-10-CM | POA: Diagnosis not present

## 2023-06-07 DIAGNOSIS — Z79899 Other long term (current) drug therapy: Secondary | ICD-10-CM | POA: Diagnosis not present

## 2023-06-07 DIAGNOSIS — F17200 Nicotine dependence, unspecified, uncomplicated: Secondary | ICD-10-CM | POA: Diagnosis not present

## 2023-06-07 DIAGNOSIS — E039 Hypothyroidism, unspecified: Secondary | ICD-10-CM | POA: Diagnosis not present

## 2023-06-12 ENCOUNTER — Other Ambulatory Visit: Payer: Self-pay | Admitting: Internal Medicine

## 2023-08-27 ENCOUNTER — Other Ambulatory Visit: Payer: Self-pay | Admitting: Internal Medicine

## 2023-10-17 NOTE — Unmapped (Signed)
 Specialty Medication(s): Dupixent    Ivan Rhodes has been dis-enrolled from the [facility] specialty pharmacy services as a result of multiple unsuccessful outreach attempts by the pharmacy.    Additional information provided to the patient: n/a    Oliva Bustard, PharmD  [facility] Specialty Pharmacist

## 2023-12-08 ENCOUNTER — Other Ambulatory Visit: Payer: Self-pay | Admitting: Internal Medicine

## 2023-12-18 ENCOUNTER — Telehealth: Admitting: Internal Medicine

## 2023-12-18 ENCOUNTER — Encounter: Payer: Self-pay | Admitting: Internal Medicine

## 2023-12-18 DIAGNOSIS — F988 Other specified behavioral and emotional disorders with onset usually occurring in childhood and adolescence: Secondary | ICD-10-CM

## 2023-12-18 MED ORDER — AMPHETAMINE-DEXTROAMPHETAMINE 30 MG PO TABS: 30.0000 mg | ORAL_TABLET | Freq: Every day | ORAL | 0 refills | Status: AC

## 2023-12-18 MED ORDER — AMPHETAMINE-DEXTROAMPHETAMINE 30 MG PO TABS
30.0000 mg | ORAL_TABLET | Freq: Every day | ORAL | 0 refills | Status: AC
Start: 2024-02-16 — End: 2024-04-16

## 2023-12-18 NOTE — Progress Notes (Signed)
 Office Visit  Subjective   Patient ID: Don Reynolds   DOB: 07/10/1961   Age: 63 y.o.   MRN: [id number]   Chief Complaint No chief complaint on file.    History of Present Illness The patient is a 63 year old Caucasian/White male who returns for followup of his ADD with a telehealth visit.  The patient states today that his ADD is controlled on his current ADD dose.   He is currently on adderall 30mg  po BID.  He denies any side effects from his medications.  He states it helped him focused but he is interested in trying adderall at this time.  Again, he has been having problems with concentration in the past.  He states his concentration was not that great where he could not sit down and read a book and he was easily distracted.  He does have problems with procrastination and difficulty following tasks through to completion.  There are also problems with organization skills.  He does not fidget and he denies any racing thoughts.  However, he admits he does talk excessively and has difficulty waiting his turn and interrupts people at times.  Otherwise, he denies any depression or anxiety.       Past Medical History Past Medical History:  Diagnosis Date   ADD (attention deficit disorder)    Hypothyroidism      Allergies No Known Allergies   Medications  Current Outpatient Medications:    amphetamine -dextroamphetamine  (ADDERALL) 30 MG tablet, Take 1 tablet by mouth daily before breakfast., Disp: 30 tablet, Rfl: 0   amphetamine -dextroamphetamine  (ADDERALL) 30 MG tablet, Take 1 tablet by mouth daily before breakfast., Disp: 30 tablet, Rfl: 0   amphetamine -dextroamphetamine  (ADDERALL) 30 MG tablet, Take 1 tablet by mouth daily before breakfast., Disp: 30 tablet, Rfl: 0   amphetamine -dextroamphetamine  (ADDERALL) 30 MG tablet, Take 1 tablet by mouth 2 (two) times daily., Disp: 60 tablet, Rfl: 0   amphetamine -dextroamphetamine  (ADDERALL) 30 MG tablet, Take 1 tablet by mouth daily before  breakfast., Disp: 30 tablet, Rfl: 0   amphetamine -dextroamphetamine  (ADDERALL) 30 MG tablet, Take 1 tablet by mouth daily before breakfast., Disp: 30 tablet, Rfl: 0   amphetamine -dextroamphetamine  (ADDERALL) 30 MG tablet, Take 1 tablet by mouth daily before breakfast., Disp: 30 tablet, Rfl: 0   amphetamine -dextroamphetamine  (ADDERALL) 30 MG tablet, Take 1 tablet by mouth 2 (two) times daily., Disp: 60 tablet, Rfl: 0   amphetamine -dextroamphetamine  (ADDERALL) 30 MG tablet, Take 1 tablet by mouth 2 (two) times daily., Disp: 60 tablet, Rfl: 0   amphetamine -dextroamphetamine  (ADDERALL) 30 MG tablet, Take 1 tablet by mouth 2 (two) times daily., Disp: 60 tablet, Rfl: 0   DUPIXENT 300 MG/2ML prefilled syringe, Inject 300 mg into the skin every 14 (fourteen) days., Disp: , Rfl:    hydrocortisone  (ANUSOL -HC) 2.5 % rectal cream, Place 1 Application rectally 2 (two) times daily., Disp: 30 g, Rfl: 0   levothyroxine  (SYNTHROID ) 112 MCG tablet, TAKE 1 TABLET(112 MCG) BY MOUTH DAILY, Disp: 90 tablet, Rfl: 0   pantoprazole  (PROTONIX ) 40 MG tablet, Take 1 tablet (40 mg total) by mouth daily., Disp: 30 tablet, Rfl: 2   Review of Systems Review of Systems  Constitutional:  Negative for chills and fever.  Eyes:  Negative for blurred vision.  Respiratory:  Negative for shortness of breath.   Cardiovascular:  Negative for chest pain and palpitations.  Gastrointestinal:  Negative for abdominal pain, nausea and vomiting.       Objective:    Vitals There were  no vitals taken for this visit.   Physical Examination Physical Exam Constitutional:      Appearance: Normal appearance.  Neurological:     Mental Status: He is alert.  Psychiatric:        Mood and Affect: Mood normal.        Behavior: Behavior normal.        Assessment & Plan:   Attention deficit disorder There is no change in his ADD and it seems to be controlled with talking to the patient.  He is not having any problems at work because of  his ADD.  WE will refill his meds for 3 months.    Return in about 3 months (around 03/19/2024) for annual.   Wayne Haines, MD

## 2023-12-18 NOTE — Assessment & Plan Note (Signed)
 There is no change in his ADD and it seems to be controlled with talking to the patient.  He is not having any problems at work because of his ADD.  WE will refill his meds for 3 months.

## 2024-03-29 ENCOUNTER — Telehealth: Admitting: Internal Medicine

## 2024-03-29 ENCOUNTER — Encounter: Payer: Self-pay | Admitting: Internal Medicine

## 2024-03-29 DIAGNOSIS — F902 Attention-deficit hyperactivity disorder, combined type: Secondary | ICD-10-CM | POA: Diagnosis not present

## 2024-03-29 MED ORDER — AMPHETAMINE-DEXTROAMPHETAMINE 30 MG PO TABS: 30.0000 mg | ORAL_TABLET | Freq: Every day | ORAL | 0 refills | Status: AC

## 2024-03-29 MED ORDER — AMPHETAMINE-DEXTROAMPHETAMINE 30 MG PO TABS
30.0000 mg | ORAL_TABLET | Freq: Every day | ORAL | 0 refills | Status: AC
Start: 2024-04-28 — End: 2024-05-28

## 2024-03-29 NOTE — Assessment & Plan Note (Signed)
 He seems stable.  We will refill his medications toady.

## 2024-03-29 NOTE — Progress Notes (Signed)
   Office Visit  Subjective   Patient ID: Don Reynolds   DOB: 1961-01-31   Age: 63 y.o.   MRN: [id number]   Chief Complaint No chief complaint on file.    History of Present Illness The patient is a 63 year old Caucasian/White male who calls in for a telehealth for his ADD.  Since his last visit, there has been no problems.  The patient states today that his ADD is controlled on his current ADD dose.   He is currently on adderall 30mg  po BID.  He denies any side effects from his medications.  He states it helped him focused but he is interested in trying adderall at this time.  Again, he has been having problems with concentration in the past.  He states his concentration was not that great where he could not sit down and read a book and he was easily distracted.  He does have problems with procrastination and difficulty following tasks through to completion.  There are also problems with organization skills.  He does not fidget and he denies any racing thoughts.  However, he admits he does talk excessively and has difficulty waiting his turn and interrupts people at times.  Otherwise, he denies any depression or anxiety.      Past Medical History Past Medical History:  Diagnosis Date   ADD (attention deficit disorder)    Hypothyroidism      Allergies No Known Allergies   Medications  Current Outpatient Medications:    amphetamine -dextroamphetamine  (ADDERALL) 30 MG tablet, Take 1 tablet by mouth daily before breakfast., Disp: 30 tablet, Rfl: 0   amphetamine -dextroamphetamine  (ADDERALL) 30 MG tablet, Take 1 tablet by mouth daily before breakfast., Disp: 30 tablet, Rfl: 0   amphetamine -dextroamphetamine  (ADDERALL) 30 MG tablet, Take 1 tablet by mouth daily before breakfast., Disp: 30 tablet, Rfl: 0   DUPIXENT 300 MG/2ML prefilled syringe, Inject 300 mg into the skin every 14 (fourteen) days., Disp: , Rfl:    hydrocortisone  (ANUSOL -HC) 2.5 % rectal cream, Place 1 Application rectally 2  (two) times daily., Disp: 30 g, Rfl: 0   levothyroxine  (SYNTHROID ) 112 MCG tablet, TAKE 1 TABLET(112 MCG) BY MOUTH DAILY, Disp: 90 tablet, Rfl: 0   pantoprazole  (PROTONIX ) 40 MG tablet, Take 1 tablet (40 mg total) by mouth daily., Disp: 30 tablet, Rfl: 2   Review of Systems Review of Systems  Constitutional:  Negative for chills and fever.  Eyes:  Negative for blurred vision.  Respiratory:  Negative for shortness of breath.   Cardiovascular:  Negative for chest pain, palpitations and leg swelling.  Gastrointestinal:  Negative for abdominal pain, constipation, diarrhea, nausea and vomiting.  Neurological:  Negative for dizziness, weakness and headaches.       Objective:    Vitals There were no vitals taken for this visit.   Physical Examination Physical Exam Constitutional:      Appearance: Normal appearance.  Neurological:     Mental Status: He is alert.  Psychiatric:        Mood and Affect: Mood normal.        Behavior: Behavior normal.        Assessment & Plan:   Attention deficit disorder He seems stable.  We will refill his medications toady.    Return in about 3 months (around 06/29/2024).   Selinda Fleeta Finger, MD

## 2024-04-02 DIAGNOSIS — J330 Polyp of nasal cavity: Secondary | ICD-10-CM | POA: Diagnosis not present

## 2024-04-02 DIAGNOSIS — J310 Chronic rhinitis: Secondary | ICD-10-CM | POA: Diagnosis not present

## 2024-07-09 ENCOUNTER — Other Ambulatory Visit: Payer: Self-pay | Admitting: Internal Medicine

## 2024-07-09 MED ORDER — AMPHETAMINE-DEXTROAMPHETAMINE 30 MG PO TABS: 30.0000 mg | ORAL_TABLET | Freq: Every day | ORAL | 0 refills | Status: AC
